# Patient Record
Sex: Male | Born: 1995 | Race: White | Hispanic: No | Marital: Single
Health system: Southern US, Community
[De-identification: ages and names within clinical notes are randomized; demographics above are authoritative.]

---
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
## 2019-10-20 ENCOUNTER — Encounter (HOSPITAL_COMMUNITY): Payer: Self-pay

## 2019-10-20 ENCOUNTER — Emergency Department (HOSPITAL_COMMUNITY)
Admission: EM | Admit: 2019-10-20 | Discharge: 2019-10-20 | Disposition: A | Discharge status: Home / Self Care | Payer: 59 | Attending: Emergency Medicine | Admitting: Emergency Medicine

## 2019-10-20 ENCOUNTER — Other Ambulatory Visit: Payer: Self-pay

## 2019-10-20 ENCOUNTER — Emergency Department (HOSPITAL_COMMUNITY): Payer: 59

## 2019-10-20 DIAGNOSIS — Y939 Activity, unspecified: Secondary | ICD-10-CM | POA: Insufficient documentation

## 2019-10-20 DIAGNOSIS — S91212A Laceration without foreign body of left great toe with damage to nail, initial encounter: Secondary | ICD-10-CM | POA: Insufficient documentation

## 2019-10-20 DIAGNOSIS — Y9259 Other trade areas as the place of occurrence of the external cause: Secondary | ICD-10-CM | POA: Insufficient documentation

## 2019-10-20 DIAGNOSIS — Y999 Unspecified external cause status: Secondary | ICD-10-CM | POA: Diagnosis not present

## 2019-10-20 DIAGNOSIS — Z23 Encounter for immunization: Secondary | ICD-10-CM | POA: Diagnosis not present

## 2019-10-20 DIAGNOSIS — W208XXA Other cause of strike by thrown, projected or falling object, initial encounter: Secondary | ICD-10-CM | POA: Diagnosis not present

## 2019-10-20 DIAGNOSIS — S99922A Unspecified injury of left foot, initial encounter: Secondary | ICD-10-CM | POA: Diagnosis present

## 2019-10-20 DIAGNOSIS — S92425B Nondisplaced fracture of distal phalanx of left great toe, initial encounter for open fracture: Secondary | ICD-10-CM | POA: Diagnosis not present

## 2019-10-20 DIAGNOSIS — T1490XA Injury, unspecified, initial encounter: Secondary | ICD-10-CM

## 2019-10-20 DIAGNOSIS — S91219A Laceration without foreign body of unspecified toe(s) with damage to nail, initial encounter: Secondary | ICD-10-CM

## 2019-10-20 DIAGNOSIS — S91209A Unspecified open wound of unspecified toe(s) with damage to nail, initial encounter: Secondary | ICD-10-CM

## 2019-10-20 MED ORDER — CEPHALEXIN 500 MG PO CAPS: 500.0000 mg | ORAL_CAPSULE | Freq: Four times a day (QID) | ORAL | 0 refills | Status: AC

## 2019-10-20 MED ORDER — OXYCODONE-ACETAMINOPHEN 5-325 MG PO TABS: 1.0000 | ORAL_TABLET | Freq: Four times a day (QID) | ORAL | 0 refills | Status: AC | PRN

## 2019-10-20 MED ORDER — LIDOCAINE HCL 2 % IJ SOLN
20.0000 mL | Freq: Once | INTRAMUSCULAR | Status: AC
  Administered 2019-10-20: 400 mg via INTRADERMAL
  Filled 2019-10-20: qty 20

## 2019-10-20 MED ORDER — CEPHALEXIN 500 MG PO CAPS
500.0000 mg | ORAL_CAPSULE | Freq: Once | ORAL | Status: AC
  Administered 2019-10-20: 500 mg via ORAL
  Filled 2019-10-20: qty 1

## 2019-10-20 MED ORDER — TETANUS-DIPHTH-ACELL PERTUSSIS 5-2.5-18.5 LF-MCG/0.5 IM SUSP
0.5000 mL | Freq: Once | INTRAMUSCULAR | Status: AC
  Administered 2019-10-20: 0.5 mL via INTRAMUSCULAR
  Filled 2019-10-20: qty 0.5

## 2019-10-20 MED ORDER — OXYCODONE-ACETAMINOPHEN 5-325 MG PO TABS
1.0000 | ORAL_TABLET | Freq: Once | ORAL | Status: AC
  Administered 2019-10-20: 1 via ORAL
  Filled 2019-10-20: qty 1

## 2019-10-20 NOTE — ED Provider Notes (Signed)
[facility] DEPT Provider Note   CSN: [id number] Arrival date & time: 10/20/19  2141     History Chief Complaint  Patient presents with  . Toe Injury    Angel Acosta is a 24 y.o. male.  Crush injury to left great toe around 7 PM.  Dropped a heavy weight at the gym.  Complaining of throbbing pain and bleeding.  Unknown last tetanus.  Denies other injuries or complaints.  The history is provided by the patient.  Foot Injury Location:  Toe Injury: yes   Mechanism of injury: crush   Crush:    Mechanism:  Falling object   Approximate weight of object:  45 Toe location:  L great toe Pain details:    Quality:  Throbbing   Radiates to:  Does not radiate   Severity:  Moderate   Onset quality:  Sudden   Timing:  Constant   Progression:  Unchanged Chronicity:  New Tetanus status:  Unknown Prior injury to area:  No Relieved by:  None tried Worsened by:  Bearing weight Ineffective treatments:  None tried Associated symptoms: no fever        History reviewed. No pertinent past medical history.  There are no problems to display for this patient.   History reviewed. No pertinent surgical history.     No family history on file.  Social History   Tobacco Use  . Smoking status: Not on file  Substance Use Topics  . Alcohol use: Not on file  . Drug use: Not on file    Home Medications Prior to Admission medications   Not on File    Allergies    Patient has no known allergies.  Review of Systems   Review of Systems  Constitutional: Negative for fever.  Skin: Positive for wound.    Physical Exam Updated Vital Signs BP 135/79 (BP Location: Left Arm)   Pulse 65   Temp 99.4 F (37.4 C) (Oral)   Resp 17   SpO2 99%   Physical Exam Vitals and nursing note reviewed.  Constitutional:      Appearance: He is well-developed.  HENT:     Head: Normocephalic and atraumatic.  Eyes:     Conjunctiva/sclera: Conjunctivae normal.    Pulmonary:     Effort: Pulmonary effort is normal.  Musculoskeletal:        General: Tenderness and signs of injury present.     Cervical back: Neck supple.     Comments: Is a crush injury to his left great toe.  Likely through the nailbed and probably fractured underneath.  Other digits unaffected.  Skin:    General: Skin is warm and dry.     Capillary Refill: Capillary refill takes less than 2 seconds.  Neurological:     Mental Status: He is alert.     GCS: GCS eye subscore is 4. GCS verbal subscore is 5. GCS motor subscore is 6.     ED Results / Procedures / Treatments   Labs (all labs ordered are listed, but only abnormal results are displayed) Labs Reviewed - No data to display  EKG None  Radiology DG Foot Complete Left  Result Date: 10/20/2019 CLINICAL DATA:  Dropped weight on toe EXAM: LEFT FOOT - COMPLETE 3+ VIEW COMPARISON:  None. FINDINGS: Minimally displaced fracture involving the lateral base of the first distal phalanx extending into the first interphalangeal joint. There is a displaced fracture of the distal phalangeal tuft as well. Overlying soft tissue swelling  and small amount of soft tissue gas with irregularity of the nail bed and overlying bandaging material. IMPRESSION: Minimally displaced fracture involving the lateral base of the first distal phalanx extending into the first interphalangeal joint. More notably displaced fracture of the distal phalangeal tuft. Injury of the nail bed, consider antibiotic prophylaxis as this is an open fracture equivalent. These results were called by telephone at the time of interpretation on 10/20/2019 at 10:48 pm to provider [facility] , who verbally acknowledged these results. Electronically Signed   By: Kreg Shropshire M.D.   On: 10/20/2019 22:48    Procedures .Marland KitchenLaceration Repair  Date/Time: 10/20/2019 11:33 PM Performed by: Terrilee Files, MD Authorized by: Terrilee Files, MD   Consent:    Consent obtained:  Verbal    Consent given by:  Patient   Risks discussed:  Infection, pain, poor cosmetic result, poor wound healing and retained foreign body   Alternatives discussed:  No treatment and delayed treatment Anesthesia (see MAR for exact dosages):    Anesthesia method:  Nerve block   Block location:  Digital block left great toe   Block needle gauge:  24 G   Block anesthetic:  Lidocaine 2% w/o epi   Block injection procedure:  Anatomic landmarks identified, introduced needle, incremental injection and negative aspiration for blood   Block outcome:  Anesthesia achieved Laceration details:    Location:  Toe   Toe location:  L big toe   Length (cm):  3 Repair type:    Repair type:  Intermediate Pre-procedure details:    Preparation:  Patient was prepped and draped in usual sterile fashion Treatment:    Area cleansed with:  Saline   Amount of cleaning:  Standard Skin repair:    Repair method:  Sutures   Suture size:  3-0   Suture material:  Prolene   Suture technique:  Simple interrupted   Number of sutures:  5 Approximation:    Approximation:  Close Post-procedure details:    Dressing:  Non-adherent dressing   Patient tolerance of procedure:  Tolerated well, no immediate complications Comments:     Nailbed approximation with 5-0 Vicryl 3 sutures.   (including critical care time)  Medications Ordered in ED Medications  Tdap (BOOSTRIX) injection 0.5 mL (0.5 mLs Intramuscular Given 10/20/19 2227)  lidocaine (XYLOCAINE) 2 % (with pres) injection 400 mg (400 mg Intradermal Given by Other 10/20/19 2229)  cephALEXin (KEFLEX) capsule 500 mg (500 mg Oral Given 10/20/19 2330)  oxyCODONE-acetaminophen (PERCOCET/ROXICET) 5-325 MG per tablet 1 tablet (1 tablet Oral Given 10/20/19 2330)    ED Course  I have reviewed the triage vital signs and the nursing notes.  Pertinent labs & imaging results that were available during my care of the patient were reviewed by me and considered in my medical decision making  (see chart for details).    MDM Rules/Calculators/A&P                       Final Clinical Impression(s) / ED Diagnoses Final diagnoses:  Injury  Open nondisplaced fracture of distal phalanx of left great toe, initial encounter  Avulsion of toenail, initial encounter  Laceration of nail bed of toe, initial encounter    Rx / DC Orders ED Discharge Orders         Ordered    oxyCODONE-acetaminophen (PERCOCET/ROXICET) 5-325 MG tablet  Every 6 hours PRN     10/20/19 2319    cephALEXin (KEFLEX) 500 MG capsule  4 times daily     10/20/19 2319           Terrilee Files, MD 10/21/19 1005

## 2019-10-20 NOTE — ED Triage Notes (Signed)
Patient arrived stating that around 7pm he dropped a 45lb weight on his left great toe.

## 2019-10-20 NOTE — Discharge Instructions (Signed)
You were seen in the emergency department for injury to your left great toe.  You have a small nondisplaced fracture.  More importantly you avulsed the nail and lacerated the nail bed.  We repaired some of the laceration but it will be important for you to schedule a follow-up appointment with orthopedics or podiatry.  We are prescribing you antibiotics and a short course of some pain medicine.  You may remove the dressing and soak in some warm water starting Tuesday.  Return if any concerns

## 2020-08-03 IMAGING — DX DG FOOT COMPLETE 3+V*L*
3 series · 3 of 3 positions shown · non-contrast
Comparison: None.

CLINICAL DATA: Dropped weight on toe

EXAM:
LEFT FOOT - COMPLETE 3+ VIEW

[foot ap]
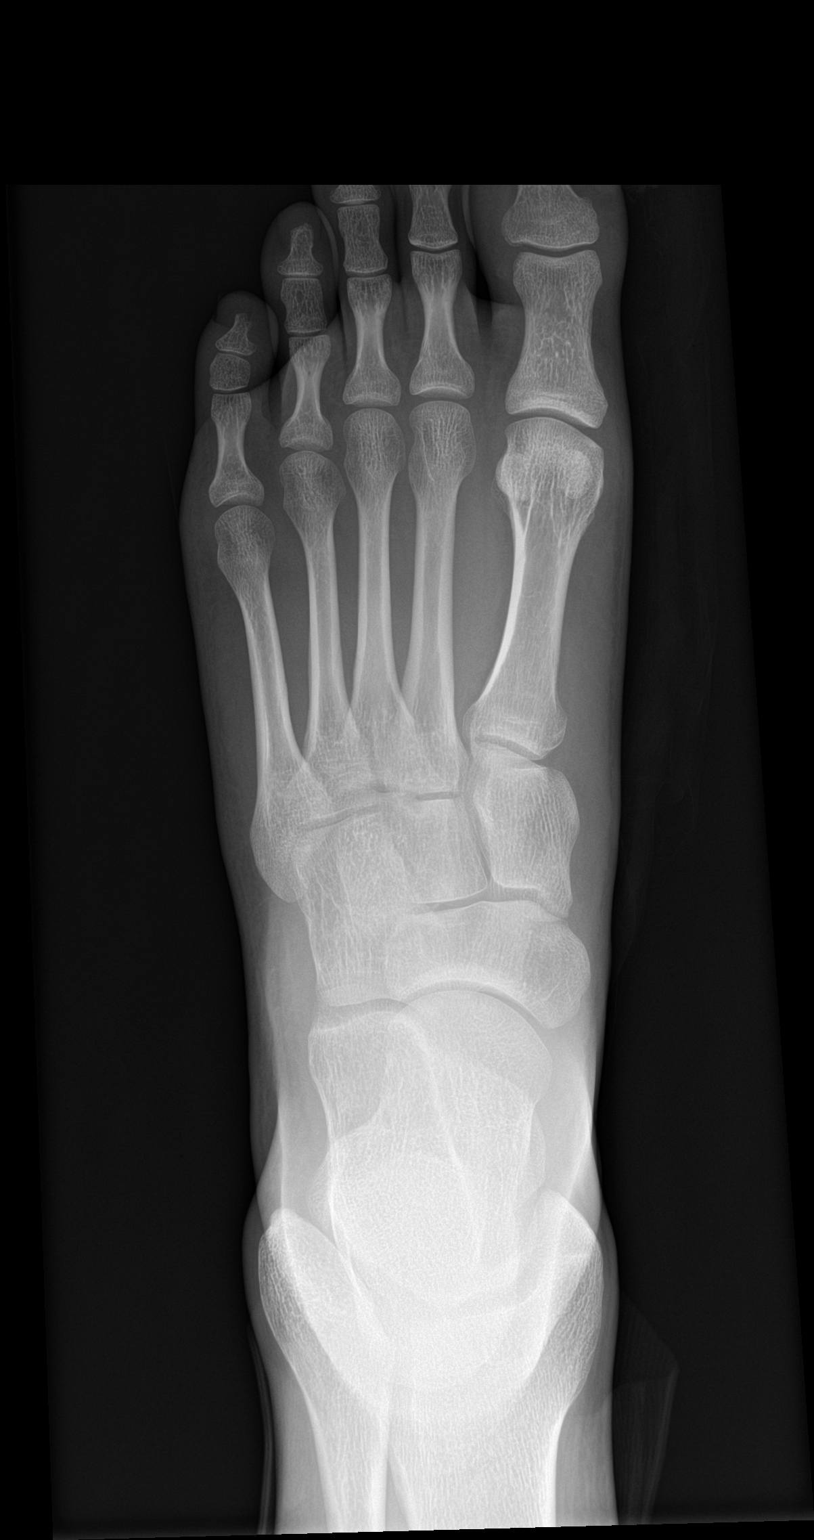

[foot obl]
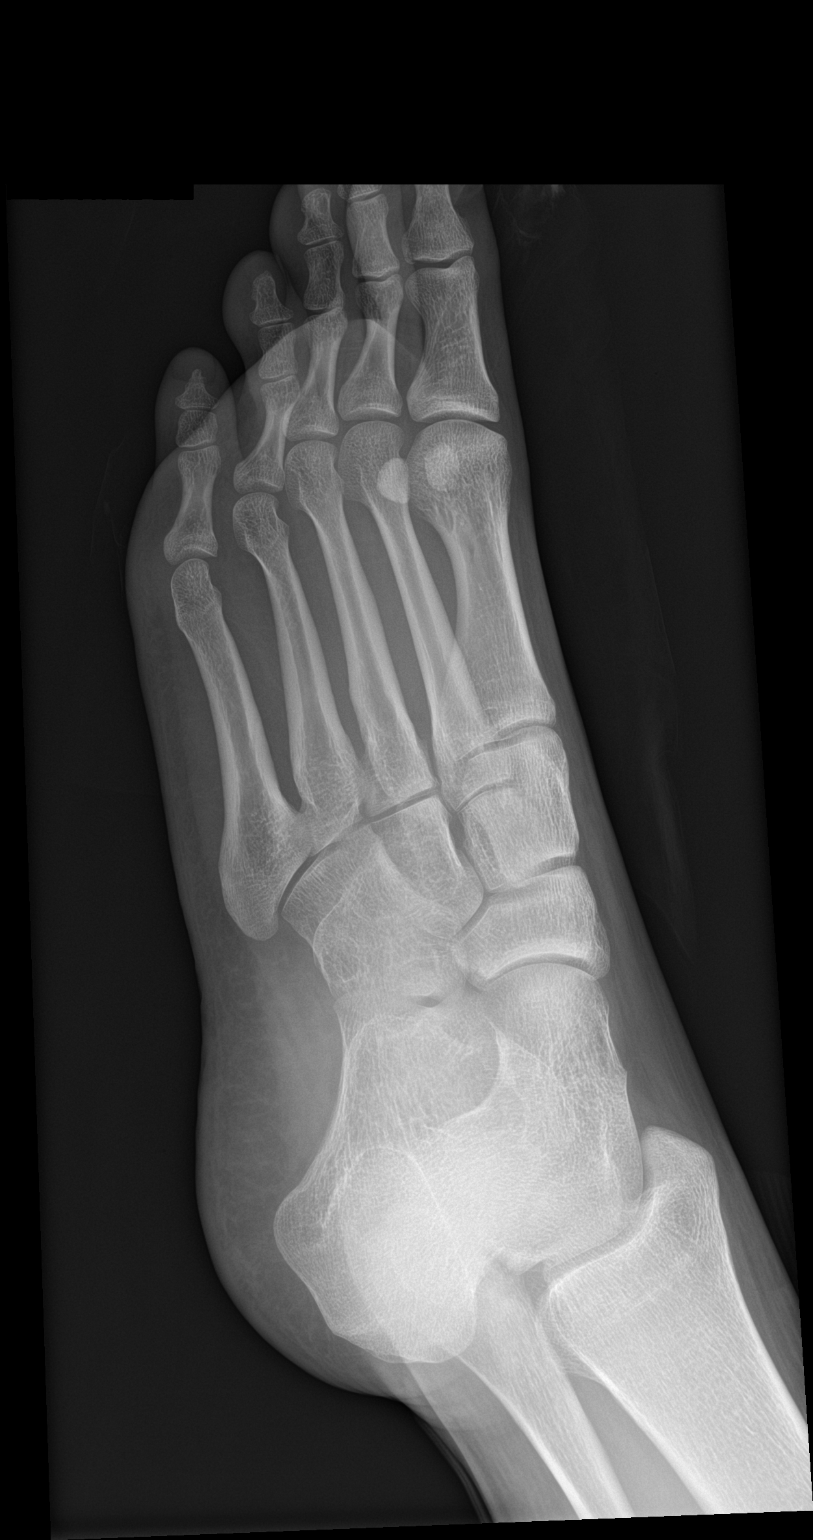

[foot lat]
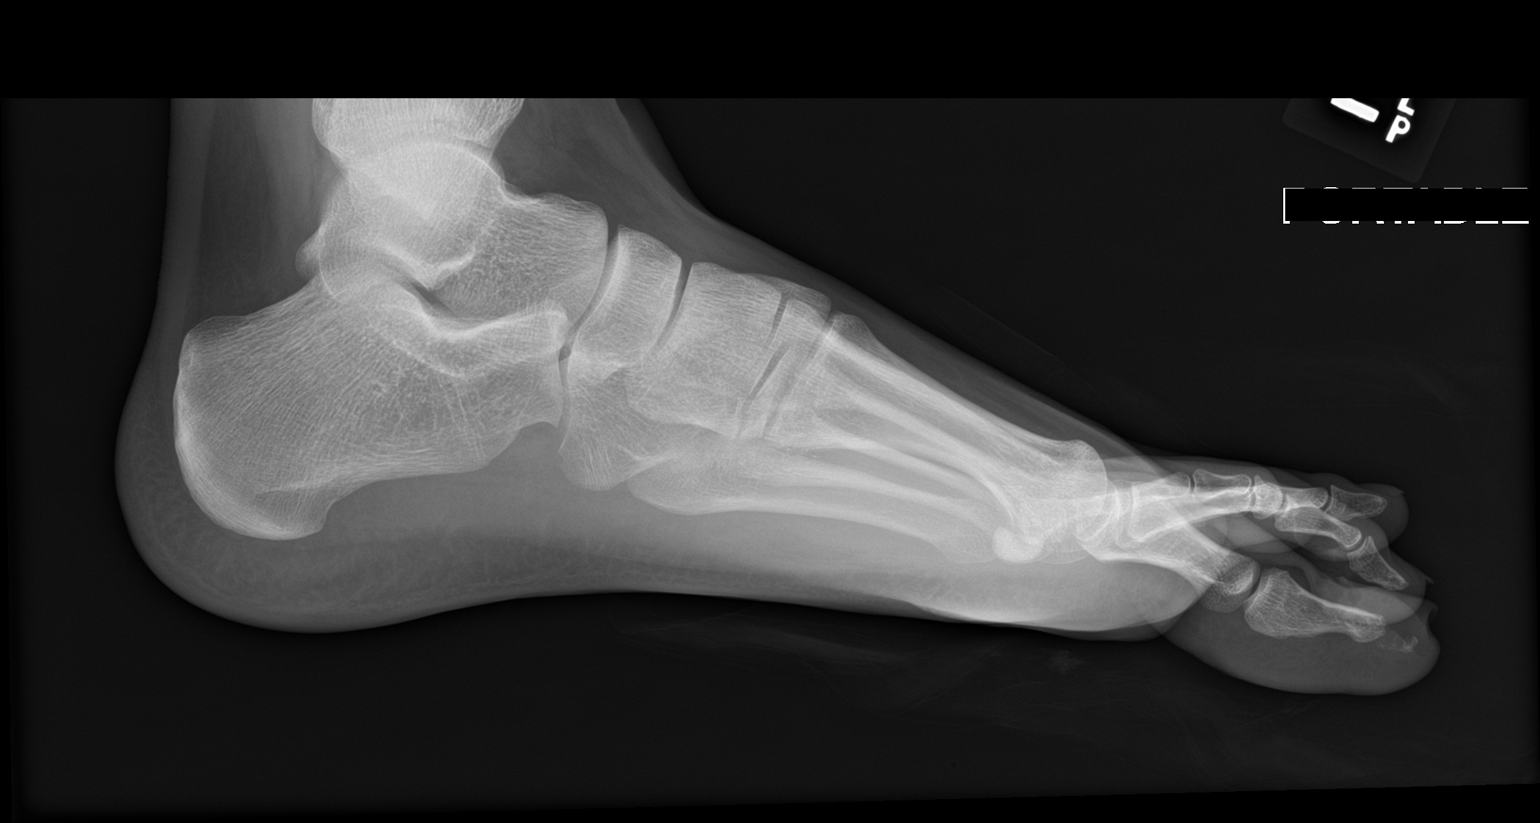

[3 of 3 positions shown; findings below may reference images not displayed]

FINDINGS: Minimally displaced fracture involving the lateral base of the first
distal phalanx extending into the first interphalangeal joint. There
is a displaced fracture of the distal phalangeal tuft as well.
Overlying soft tissue swelling and small amount of soft tissue gas
with irregularity of the nail bed and overlying bandaging material.
IMPRESSION: Minimally displaced fracture involving the lateral base of the first
distal phalanx extending into the first interphalangeal joint.

More notably displaced fracture of the distal phalangeal tuft.

Injury of the nail bed, consider antibiotic prophylaxis as this is
an open fracture equivalent.

These results were called by telephone at the time of interpretation
on 10/20/2019 at [DATE] to provider [facility] , who verbally
acknowledged these results.
